# Patient Record
Sex: Male | Born: 1990 | Race: Black or African American | Hispanic: No | Marital: Single | Smoking: Current every day smoker
Health system: Southern US, Community
[De-identification: ages and names within clinical notes are randomized; demographics above are authoritative.]

---
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
## 2011-11-14 ENCOUNTER — Encounter (HOSPITAL_COMMUNITY): Payer: Self-pay

## 2011-11-14 ENCOUNTER — Emergency Department (HOSPITAL_COMMUNITY)
Admission: EM | Admit: 2011-11-14 | Discharge: 2011-11-14 | Disposition: A | Discharge status: Home / Self Care | Payer: No Typology Code available for payment source | Attending: Emergency Medicine | Admitting: Emergency Medicine

## 2011-11-14 ENCOUNTER — Emergency Department (HOSPITAL_COMMUNITY): Payer: No Typology Code available for payment source

## 2011-11-14 DIAGNOSIS — M545 Low back pain, unspecified: Secondary | ICD-10-CM | POA: Insufficient documentation

## 2011-11-14 DIAGNOSIS — Y92410 Unspecified street and highway as the place of occurrence of the external cause: Secondary | ICD-10-CM | POA: Insufficient documentation

## 2011-11-14 DIAGNOSIS — M79609 Pain in unspecified limb: Secondary | ICD-10-CM | POA: Insufficient documentation

## 2011-11-14 DIAGNOSIS — T07XXXA Unspecified multiple injuries, initial encounter: Secondary | ICD-10-CM

## 2011-11-14 DIAGNOSIS — IMO0002 Reserved for concepts with insufficient information to code with codable children: Secondary | ICD-10-CM | POA: Insufficient documentation

## 2011-11-14 DIAGNOSIS — R51 Headache: Secondary | ICD-10-CM | POA: Insufficient documentation

## 2011-11-14 DIAGNOSIS — S9030XA Contusion of unspecified foot, initial encounter: Secondary | ICD-10-CM | POA: Insufficient documentation

## 2011-11-14 MED ORDER — IBUPROFEN 600 MG PO TABS: 600.0000 mg | ORAL_TABLET | Freq: Once | ORAL | Status: AC

## 2011-11-14 MED ORDER — IBUPROFEN 200 MG PO TABS
600.0000 mg | ORAL_TABLET | Freq: Once | ORAL | Status: AC
  Administered 2011-11-14: 600 mg via ORAL
  Filled 2011-11-14: qty 3

## 2011-11-14 MED ORDER — BACITRACIN-NEOMYCIN-POLYMYXIN OINTMENT TUBE
TOPICAL_OINTMENT | Freq: Once | CUTANEOUS | Status: AC
  Administered 2011-11-14: 1 via TOPICAL
  Filled 2011-11-14: qty 15

## 2011-11-14 NOTE — ED Notes (Signed)
Patient transported to X-ray 
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
## 2011-11-14 NOTE — Discharge Instructions (Signed)
Abrasions Abrasions are skin scrapes. Their treatment depends on how large and deep the abrasion is. Abrasions do not extend through all layers of the skin. A cut or lesion through all skin layers is called a laceration. HOME CARE INSTRUCTIONS   If you were given a dressing, change it at least once a day or as instructed by your caregiver. If the bandage sticks, soak it off with a solution of water or hydrogen peroxide.   Twice a day, wash the area with soap and water to remove all the cream/ointment. You may do this in a sink, under a tub faucet, or in a shower. Rinse off the soap and pat dry with a clean towel. Look for signs of infection (see below).   Reapply cream/ointment according to your caregiver's instruction. This will help prevent infection and keep the bandage from sticking. Telfa or gauze over the wound and under the dressing or wrap will also help keep the bandage from sticking.   If the bandage becomes wet, dirty, or develops a foul smell, change it as soon as possible.   Only take over-the-counter or prescription medicines for pain, discomfort, or fever as directed by your caregiver.  SEEK IMMEDIATE MEDICAL CARE IF:   Increasing pain in the wound.   Signs of infection develop: redness, swelling, surrounding area is tender to touch, or pus coming from the wound.   You have a fever.   Any foul smell coming from the wound or dressing.  Most skin wounds heal within ten days. Facial wounds heal faster. However, an infection may occur despite proper treatment. You should have the wound checked for signs of infection within 24 to 48 hours or sooner if problems arise. If you were not given a wound-check appointment, look closely at the wound yourself on the second day for early signs of infection listed above. MAKE SURE YOU:   Understand these instructions.   Will watch your condition.   Will get help right away if you are not doing well or get worse.  Document Released:  05/10/2005 Document Revised: 07/20/2011 Document Reviewed: 07/04/2011 [employer] Patient Information 2012 [employer].Contusion A contusion is a deep bruise. Contusions are the result of an injury that caused bleeding under the skin. The contusion may turn blue, purple, or yellow. Minor injuries will give you a painless contusion, but more severe contusions may stay painful and swollen for a few weeks.  CAUSES  A contusion is usually caused by a blow, trauma, or direct force to an area of the body. SYMPTOMS   Swelling and redness of the injured area.   Bruising of the injured area.   Tenderness and soreness of the injured area.   Pain.  DIAGNOSIS  The diagnosis can be made by taking a history and physical exam. An X-ray, CT scan, or MRI may be needed to determine if there were any associated injuries, such as fractures. TREATMENT  Specific treatment will depend on what area of the body was injured. In general, the best treatment for a contusion is resting, icing, elevating, and applying cold compresses to the injured area. Over-the-counter medicines may also be recommended for pain control. Ask your caregiver what the best treatment is for your contusion. HOME CARE INSTRUCTIONS   Put ice on the injured area.   Put ice in a plastic bag.   Place a towel between your skin and the bag.   Leave the ice on for 15 to 20 minutes, 3 to 4 times a day.     Only take over-the-counter or prescription medicines for pain, discomfort, or fever as directed by your caregiver. Your caregiver may recommend avoiding anti-inflammatory medicines (aspirin, ibuprofen, and naproxen) for 48 hours because these medicines may increase bruising.   Rest the injured area.   If possible, elevate the injured area to reduce swelling.  SEEK IMMEDIATE MEDICAL CARE IF:   You have increased bruising or swelling.   You have pain that is getting worse.   Your swelling or pain is not relieved with medicines.  MAKE  SURE YOU:   Understand these instructions.   Will watch your condition.   Will get help right away if you are not doing well or get worse.  Document Released: 05/10/2005 Document Revised: 07/20/2011 Document Reviewed: 06/05/2011 [facility] Patient Information 2012 [place], [place].Contusion A contusion is a deep bruise. Contusions are the result of an injury that caused bleeding under the skin. The contusion may turn blue, purple, or yellow. Minor injuries will give you a painless contusion, but more severe contusions may stay painful and swollen for a few weeks.  CAUSES  A contusion is usually caused by a blow, trauma, or direct force to an area of the body. SYMPTOMS   Swelling and redness of the injured area.   Bruising of the injured area.   Tenderness and soreness of the injured area.   Pain.  DIAGNOSIS  The diagnosis can be made by taking a history and physical exam. An X-ray, CT scan, or MRI may be needed to determine if there were any associated injuries, such as fractures. TREATMENT  Specific treatment will depend on what area of the body was injured. In general, the best treatment for a contusion is resting, icing, elevating, and applying cold compresses to the injured area. Over-the-counter medicines may also be recommended for pain control. Ask your caregiver what the best treatment is for your contusion. HOME CARE INSTRUCTIONS   Put ice on the injured area.   Put ice in a plastic bag.   Place a towel between your skin and the bag.   Leave the ice on for 15 to 20 minutes, 3 to 4 times a day.   Only take over-the-counter or prescription medicines for pain, discomfort, or fever as directed by your caregiver. Your caregiver may recommend avoiding anti-inflammatory medicines (aspirin, ibuprofen, and naproxen) for 48 hours because these medicines may increase bruising.   Rest the injured area.   If possible, elevate the injured area to reduce swelling.  SEEK IMMEDIATE  MEDICAL CARE IF:   You have increased bruising or swelling.   You have pain that is getting worse.   Your swelling or pain is not relieved with medicines.  MAKE SURE YOU:   Understand these instructions.   Will watch your condition.   Will get help right away if you are not doing well or get worse.  Document Released: 05/10/2005 Document Revised: 07/20/2011 Document Reviewed: 06/05/2011 [facility] Patient Information 2012 [place], [place]. The x-ray of your foot is normal
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
## 2011-11-17 ENCOUNTER — Emergency Department (HOSPITAL_COMMUNITY)
Admission: EM | Admit: 2011-11-17 | Discharge: 2011-11-17 | Disposition: A | Discharge status: Home / Self Care | Payer: No Typology Code available for payment source | Attending: Emergency Medicine | Admitting: Emergency Medicine

## 2011-11-17 ENCOUNTER — Encounter (HOSPITAL_COMMUNITY): Payer: Self-pay | Admitting: Family Medicine

## 2011-11-17 DIAGNOSIS — T07XXXA Unspecified multiple injuries, initial encounter: Secondary | ICD-10-CM

## 2011-11-17 DIAGNOSIS — IMO0002 Reserved for concepts with insufficient information to code with codable children: Secondary | ICD-10-CM | POA: Insufficient documentation

## 2011-11-17 DIAGNOSIS — F17200 Nicotine dependence, unspecified, uncomplicated: Secondary | ICD-10-CM | POA: Insufficient documentation

## 2011-11-17 DIAGNOSIS — Z880 Allergy status to penicillin: Secondary | ICD-10-CM | POA: Insufficient documentation

## 2011-11-17 DIAGNOSIS — Y92410 Unspecified street and highway as the place of occurrence of the external cause: Secondary | ICD-10-CM | POA: Insufficient documentation

## 2011-11-17 DIAGNOSIS — S139XXA Sprain of joints and ligaments of unspecified parts of neck, initial encounter: Secondary | ICD-10-CM | POA: Insufficient documentation

## 2011-11-17 DIAGNOSIS — S134XXA Sprain of ligaments of cervical spine, initial encounter: Secondary | ICD-10-CM

## 2011-11-17 MED ORDER — IBUPROFEN 600 MG PO TABS: 600.0000 mg | ORAL_TABLET | Freq: Three times a day (TID) | ORAL | Status: AC

## 2011-11-17 MED ORDER — METHOCARBAMOL 500 MG PO TABS: 500.0000 mg | ORAL_TABLET | Freq: Four times a day (QID) | ORAL | Status: AC | PRN
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
## 2011-11-18 NOTE — ED Provider Notes (Signed)
Medical screening examination/treatment/procedure(s) were performed by non-physician practitioner and as supervising physician I was immediately available for consultation/collaboration.  Demaris Bousquet, MD 11/18/11 0006 

## 2011-11-19 NOTE — ED Provider Notes (Signed)
Medical screening examination/treatment/procedure(s) were performed by non-physician practitioner and as supervising physician I was immediately available for consultation/collaboration.   Dennis Clayton. Oletta Lamas, MD 11/19/11 [phone]

## 2012-06-06 ENCOUNTER — Encounter (HOSPITAL_COMMUNITY): Payer: Self-pay | Admitting: *Deleted

## 2012-06-06 ENCOUNTER — Emergency Department (HOSPITAL_COMMUNITY): Payer: Self-pay

## 2012-06-06 ENCOUNTER — Emergency Department (HOSPITAL_COMMUNITY)
Admission: EM | Admit: 2012-06-06 | Discharge: 2012-06-06 | Disposition: A | Discharge status: Home / Self Care | Payer: Self-pay | Attending: Emergency Medicine | Admitting: Emergency Medicine

## 2012-06-06 DIAGNOSIS — F17200 Nicotine dependence, unspecified, uncomplicated: Secondary | ICD-10-CM | POA: Insufficient documentation

## 2012-06-06 DIAGNOSIS — S61409A Unspecified open wound of unspecified hand, initial encounter: Secondary | ICD-10-CM | POA: Insufficient documentation

## 2012-06-06 DIAGNOSIS — S61411A Laceration without foreign body of right hand, initial encounter: Secondary | ICD-10-CM

## 2012-06-06 DIAGNOSIS — Y929 Unspecified place or not applicable: Secondary | ICD-10-CM | POA: Insufficient documentation

## 2012-06-06 DIAGNOSIS — Z23 Encounter for immunization: Secondary | ICD-10-CM | POA: Insufficient documentation

## 2012-06-06 DIAGNOSIS — W268XXA Contact with other sharp object(s), not elsewhere classified, initial encounter: Secondary | ICD-10-CM | POA: Insufficient documentation

## 2012-06-06 DIAGNOSIS — Y9389 Activity, other specified: Secondary | ICD-10-CM | POA: Insufficient documentation

## 2012-06-06 MED ORDER — HYDROCODONE-ACETAMINOPHEN 5-500 MG PO TABS: 1.0000 | ORAL_TABLET | Freq: Four times a day (QID) | ORAL | Status: DC | PRN

## 2012-06-06 MED ORDER — TETANUS-DIPHTH-ACELL PERTUSSIS 5-2.5-18.5 LF-MCG/0.5 IM SUSP
0.5000 mL | Freq: Once | INTRAMUSCULAR | Status: AC
  Administered 2012-06-06: 0.5 mL via INTRAMUSCULAR
  Filled 2012-06-06: qty 0.5

## 2012-06-06 MED ORDER — LIDOCAINE-EPINEPHRINE 2 %-1:100000 IJ SOLN
20.0000 mL | Freq: Once | INTRAMUSCULAR | Status: AC
  Administered 2012-06-06: 20 mL via INTRADERMAL
  Filled 2012-06-06: qty 20

## 2012-06-06 MED ORDER — OXYCODONE-ACETAMINOPHEN 5-325 MG PO TABS
1.0000 | ORAL_TABLET | Freq: Once | ORAL | Status: AC
  Administered 2012-06-06: 1 via ORAL
  Filled 2012-06-06: qty 1
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
## 2012-06-06 NOTE — ED Notes (Signed)
MD at bedside suturing

## 2012-06-06 NOTE — ED Notes (Signed)
Right hand cleaned and wound dressed with non-stick dressing with kling wrap.  Patient tolerated well.

## 2012-06-06 NOTE — ED Provider Notes (Signed)
Medical screening examination/treatment/procedure(s) were conducted as a shared visit with non-physician practitioner(s) and myself.  I personally evaluated the patient during the encounter.  Inspected hand.  Noted lacerations.  No palpable FBs or deformities.  No FBs noted on imaging.  Pt tolerated closure well.  He will follow-up for suture removal.  Tobin Chad, MD 06/06/12 1722
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
## 2012-06-16 ENCOUNTER — Emergency Department (HOSPITAL_COMMUNITY)
Admission: EM | Admit: 2012-06-16 | Discharge: 2012-06-16 | Disposition: A | Discharge status: Home / Self Care | Payer: Self-pay | Attending: Emergency Medicine | Admitting: Emergency Medicine

## 2012-06-16 ENCOUNTER — Encounter (HOSPITAL_COMMUNITY): Payer: Self-pay

## 2012-06-16 DIAGNOSIS — F17200 Nicotine dependence, unspecified, uncomplicated: Secondary | ICD-10-CM | POA: Insufficient documentation

## 2012-06-16 DIAGNOSIS — Z4802 Encounter for removal of sutures: Secondary | ICD-10-CM | POA: Insufficient documentation

## 2012-06-16 NOTE — ED Notes (Signed)
Pt had sutures placed 10/25.  Pt presents today for suture removal.

## 2012-06-16 NOTE — ED Provider Notes (Signed)
History     CSN: [id number]  Arrival date & time 06/16/12  1004   First MD Initiated Contact with Patient 06/16/12 1006      Chief Complaint  Patient presents with  . Suture / Staple Removal    (Consider location/radiation/quality/duration/timing/severity/associated sxs/prior treatment) HPI Comments: Patient presents today for suture removal.  Sutures placed on 06/06/12.  He reports that he has not had any drainage from the lacerations.  Lacerations healing well.  He denies fever or chills.  Patient is a 21 y.o. male presenting with suture removal. The history is provided by the patient.  Suture / Staple Removal  There has been no treatment since the wound repair. There has been no drainage from the wound. There is no redness present. There is no swelling present. The pain has improved.    History reviewed. No pertinent past medical history.  History reviewed. No pertinent past surgical history.  No family history on file.  History  Substance Use Topics  . Smoking status: Current Every Day Smoker    Types: Cigars  . Smokeless tobacco: Not on file  . Alcohol Use: No      Review of Systems  Constitutional: Negative for fever and chills.  Skin: Positive for wound. Negative for color change.    Allergies  Penicillins  Home Medications  No current outpatient prescriptions on file.  BP 106/72  Pulse 84  Temp 99 F (37.2 C) (Oral)  Resp 16  SpO2 99%  Physical Exam  Nursing note and vitals reviewed. Constitutional: He appears well-developed and well-nourished. No distress.  HENT:  Head: Normocephalic and atraumatic.  Mouth/Throat: Oropharynx is clear and moist.  Cardiovascular: Normal rate, regular rhythm and normal heart sounds.   Pulmonary/Chest: Effort normal.  Musculoskeletal:       Right wrist: He exhibits normal range of motion and no bony tenderness.  Skin: Skin is warm and dry. He is not diaphoretic. No erythema.       Lacerations of the hand  appearing to be healing well.  No surrounding erythema, induration, or warmth.  No drainage.  Approximately 1 cm of minimal gaping of laceration after stiches removed.      ED Course  Procedures (including critical care time)  Labs Reviewed - No data to display No results found.   No diagnosis found.    MDM  Sutures removed.  Patient afebrile.  No drainage from the laceration.  No surrounding erythema or induration.  No signs of infection.  After the sutures were removed there was minimal gaping of the laceration.  Steri strips applied.          Pascal Lux Dalton, PA-C 06/16/12 2104

## 2012-06-17 NOTE — ED Provider Notes (Signed)
Medical screening examination/treatment/procedure(s) were performed by non-physician practitioner and as supervising physician I was immediately available for consultation/collaboration.   Richardean Canal, MD 06/17/12 [phone]

## 2013-01-23 ENCOUNTER — Encounter (HOSPITAL_COMMUNITY): Payer: Self-pay

## 2013-01-23 ENCOUNTER — Emergency Department (INDEPENDENT_AMBULATORY_CARE_PROVIDER_SITE_OTHER)
Admission: EM | Admit: 2013-01-23 | Discharge: 2013-01-23 | Disposition: A | Discharge status: Home / Self Care | Payer: Self-pay | Source: Home / Self Care

## 2013-01-23 DIAGNOSIS — S0100XA Unspecified open wound of scalp, initial encounter: Secondary | ICD-10-CM

## 2013-01-23 DIAGNOSIS — S0102XA Laceration with foreign body of scalp, initial encounter: Secondary | ICD-10-CM

## 2013-01-23 DIAGNOSIS — S41112A Laceration without foreign body of left upper arm, initial encounter: Secondary | ICD-10-CM

## 2013-01-23 DIAGNOSIS — S41111A Laceration without foreign body of right upper arm, initial encounter: Secondary | ICD-10-CM

## 2013-01-23 DIAGNOSIS — S41109A Unspecified open wound of unspecified upper arm, initial encounter: Secondary | ICD-10-CM

## 2013-01-23 MED ORDER — CLINDAMYCIN HCL 300 MG PO CAPS: ORAL_CAPSULE | ORAL | Status: DC
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
## 2013-01-23 NOTE — ED Notes (Signed)
States he had a tetanus shot 06-16-2012, when he had a laceration to his hand
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
## 2013-01-24 NOTE — ED Provider Notes (Signed)
Medical screening examination/treatment/procedure(s) were performed by non-physician practitioner and as supervising physician I was immediately available for consultation/collaboration.  Leslee Home, M.D.  Reuben Likes, MD 01/24/13 2120

## 2013-02-01 ENCOUNTER — Emergency Department (INDEPENDENT_AMBULATORY_CARE_PROVIDER_SITE_OTHER)
Admission: EM | Admit: 2013-02-01 | Discharge: 2013-02-01 | Disposition: A | Discharge status: Home / Self Care | Payer: Self-pay | Source: Home / Self Care | Attending: Emergency Medicine | Admitting: Emergency Medicine

## 2013-02-01 ENCOUNTER — Encounter (HOSPITAL_COMMUNITY): Payer: Self-pay | Admitting: Emergency Medicine

## 2013-02-01 DIAGNOSIS — Z4802 Encounter for removal of sutures: Secondary | ICD-10-CM

## 2013-02-01 DIAGNOSIS — IMO0002 Reserved for concepts with insufficient information to code with codable children: Secondary | ICD-10-CM
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
## 2013-02-01 NOTE — ED Notes (Signed)
Pt is here to have stitches removed... Voices no new concerns... He is alert and oriented w/no signs of acute distress.

## 2014-11-20 ENCOUNTER — Emergency Department (HOSPITAL_COMMUNITY)
Admission: EM | Admit: 2014-11-20 | Discharge: 2014-11-20 | Disposition: A | Discharge status: Home / Self Care | Payer: Self-pay | Attending: Emergency Medicine | Admitting: Emergency Medicine

## 2014-11-20 ENCOUNTER — Encounter (HOSPITAL_COMMUNITY): Payer: Self-pay | Admitting: *Deleted

## 2014-11-20 ENCOUNTER — Ambulatory Visit (HOSPITAL_COMMUNITY): Admission: RE | Admit: 2014-11-20 | Payer: Self-pay | Source: Ambulatory Visit

## 2014-11-20 DIAGNOSIS — R252 Cramp and spasm: Secondary | ICD-10-CM

## 2014-11-20 DIAGNOSIS — M79605 Pain in left leg: Secondary | ICD-10-CM | POA: Insufficient documentation

## 2014-11-20 DIAGNOSIS — Z720 Tobacco use: Secondary | ICD-10-CM | POA: Insufficient documentation

## 2014-11-20 DIAGNOSIS — Z792 Long term (current) use of antibiotics: Secondary | ICD-10-CM | POA: Insufficient documentation

## 2014-11-20 DIAGNOSIS — Z880 Allergy status to penicillin: Secondary | ICD-10-CM | POA: Insufficient documentation

## 2014-11-20 MED ORDER — IBUPROFEN 600 MG PO TABS: 600.0000 mg | ORAL_TABLET | Freq: Four times a day (QID) | ORAL | Status: DC | PRN

## 2014-11-20 MED ORDER — TRAMADOL HCL 50 MG PO TABS: 50.0000 mg | ORAL_TABLET | Freq: Four times a day (QID) | ORAL | Status: DC | PRN

## 2014-11-20 NOTE — ED Provider Notes (Signed)
CSN: [id number]     Arrival date & time 11/20/14  0508 History   First MD Initiated Contact with Patient 11/20/14 0615     Chief Complaint  Patient presents with  . Leg Pain     (Consider location/radiation/quality/duration/timing/severity/associated sxs/prior Treatment) HPI  Pt is a 24yo male presenting to ED with c/o intermittent Left leg pain from the hip down for the last several weeks.  Pt states pain is a pressure sensation in his left calf, moderate relief with ibuprofen.  Pain is cramping, 8/10 at worst. Denies recent injury or trauma including falls or heavy lifting.  Denies redness, warmth, swelling, or wounds to Left lower leg. Denies back pain. Denies chest pain or SOB. No hx of sickle cell anemia. No hx of blood clots. No hx of previous injury to lower leg. No recent travel or surgery.   History reviewed. No pertinent past medical history. History reviewed. No pertinent past surgical history. No family history on file. History  Substance Use Topics  . Smoking status: Current Every Day Smoker    Types: Cigars  . Smokeless tobacco: Not on file  . Alcohol Use: No    Review of Systems  Constitutional: Negative for fever, chills and fatigue.  Respiratory: Negative for shortness of breath.   Cardiovascular: Negative for chest pain, palpitations and leg swelling.  Gastrointestinal: Negative for nausea and vomiting.  Musculoskeletal: Positive for myalgias ( Left calf pain). Negative for back pain, joint swelling, arthralgias, gait problem, neck pain and neck stiffness.  Skin: Negative for color change, pallor, rash and wound.  Neurological: Negative for weakness and numbness.  All other systems reviewed and are negative.     Allergies  Penicillins  Home Medications   Prior to Admission medications   Medication Sig Start Date End Date Taking? Authorizing Provider  clindamycin (CLEOCIN) 300 MG capsule One cap bid for 4 days 01/23/13   Hayden Rasmussen, NP  ibuprofen  (ADVIL,MOTRIN) 600 MG tablet Take 1 tablet (600 mg total) by mouth every 6 (six) hours as needed. 11/20/14   Junius Finner, PA-C  traMADol (ULTRAM) 50 MG tablet Take 1 tablet (50 mg total) by mouth every 6 (six) hours as needed. 11/20/14   Junius Finner, PA-C   BP 116/74 mmHg  Pulse 71  Temp(Src) 98.2 F (36.8 C) (Oral)  Resp 20  Ht  (1.753 m)  Wt 155 lb 2 oz (70.364 kg)  BMI 22.90 kg/m2  SpO2 99% Physical Exam  Constitutional: He is oriented to person, place, and time. He appears well-developed and well-nourished.  HENT:  Head: Normocephalic and atraumatic.  Eyes: EOM are normal.  Neck: Normal range of motion. Neck supple.  Cardiovascular: Normal rate.   Pulses:      Dorsalis pedis pulses are 2+ on the left side.  Pulmonary/Chest: Effort normal.  Musculoskeletal: Normal range of motion. He exhibits tenderness. He exhibits no edema.  Left lower extremity: FROM left hip, knee, and ankle w/o tenderness. Mild tenderness to Left calf. No edema.   Neurological: He is alert and oriented to person, place, and time.  Left lower extremity: sensation in tact   Skin: Skin is warm and dry. No rash noted. No erythema.  Left lower extremity: skin in tact, no ecchymosis, erythema, or warmth.   Psychiatric: He has a normal mood and affect. His behavior is normal.  Nursing note and vitals reviewed.   ED Course  Procedures (including critical care time) Labs Review Labs Reviewed - No data to display  Imaging Review No results found.   EKG Interpretation None      MDM   Final diagnoses:  Cramps of left lower extremity    Pt presenting to ED with c/o intermittent Left lower leg pain that started several weeks ago. Improvement with ibuprofen.  No recent injuries. Pt is low risk for blood clots.   6:16 AM discussed with pt that a venous doppler is recommended to r/o a blood clot as no other obvious source of pt's reported pain in his Left lower leg. No evidence of underlying  infection. Pt denies CP or SOB.  Advised pt that personnel that perform venous doppler do not come in until 8AM. Pt states he has somewhere to be and requests to come back for testing.  Pt is safe to be discharged home at this time. Strict return precautions provided. Info for outpatient venous doppler provided.  Pt verbalized understanding and agreement with tx plan.   Junius Finnerrin O'Malley, PA-C 11/20/14 [id number]  Marisa Severinlga Otter, MD 11/20/14 [phone]

## 2014-11-20 NOTE — ED Notes (Signed)
Patient with pain in the left leg from the hip down.  He denies trauma.  Denies any wounds/ redness/swelling.  Patient states it feels like pressure in his leg.  He does not have hx of sickle cell.  No recent travel.  He last took ibuprofen at 0500

## 2016-01-12 ENCOUNTER — Encounter (HOSPITAL_COMMUNITY): Payer: Self-pay | Admitting: Emergency Medicine

## 2016-01-12 ENCOUNTER — Emergency Department (HOSPITAL_COMMUNITY)
Admission: EM | Admit: 2016-01-12 | Discharge: 2016-01-12 | Disposition: A | Discharge status: Home / Self Care | Payer: Self-pay | Attending: Emergency Medicine | Admitting: Emergency Medicine

## 2016-01-12 DIAGNOSIS — Z79899 Other long term (current) drug therapy: Secondary | ICD-10-CM | POA: Insufficient documentation

## 2016-01-12 DIAGNOSIS — F17200 Nicotine dependence, unspecified, uncomplicated: Secondary | ICD-10-CM | POA: Insufficient documentation

## 2016-01-12 DIAGNOSIS — L729 Follicular cyst of the skin and subcutaneous tissue, unspecified: Secondary | ICD-10-CM

## 2016-01-12 DIAGNOSIS — L0231 Cutaneous abscess of buttock: Secondary | ICD-10-CM | POA: Insufficient documentation

## 2016-01-12 DIAGNOSIS — Z791 Long term (current) use of non-steroidal anti-inflammatories (NSAID): Secondary | ICD-10-CM | POA: Insufficient documentation

## 2016-01-12 NOTE — ED Provider Notes (Signed)
CSN: [id number]     Arrival date & time 01/12/16  0522 History   First MD Initiated Contact with Patient 01/12/16 [phone]     Chief Complaint  Patient presents with  . Tailbone Pain     (Consider location/radiation/quality/duration/timing/severity/associated sxs/prior Treatment) HPI  Dennis Clayton is a(n) 25 y.o. male who presents to the ED with a cc of mass on tailbone. He states he's noticed that for several months. However, it is tender when he sits on it. He denies pain when he is not sitting. He denies drainage or pain with defecation. No previous injuries. He denies any fevers or chills.   No past medical history on file. No past surgical history on file. No family history on file. Social History  Substance Use Topics  . Smoking status: Current Every Day Smoker    Types: Cigars  . Smokeless tobacco: Not on file  . Alcohol Use: No    Review of Systems  Constitutional: Negative for fever and chills.  Skin: Negative for color change and wound.       mass      Allergies  Penicillins  Home Medications   Prior to Admission medications   Medication Sig Start Date End Date Taking? Authorizing Provider  clindamycin (CLEOCIN) 300 MG capsule One cap bid for 4 days 01/23/13   Hayden Rasmussenavid Mabe, NP  ibuprofen (ADVIL,MOTRIN) 600 MG tablet Take 1 tablet (600 mg total) by mouth every 6 (six) hours as needed. 11/20/14   Junius FinnerErin O'Malley, PA-C  traMADol (ULTRAM) 50 MG tablet Take 1 tablet (50 mg total) by mouth every 6 (six) hours as needed. 11/20/14   Junius FinnerErin O'Malley, PA-C   BP 133/88 mmHg  Pulse 77  Temp(Src) 99 F (37.2 C) (Oral)  Resp 20  Wt 68.04 kg  SpO2 100% Physical Exam  Constitutional: He appears well-developed and well-nourished. No distress.  HENT:  Head: Normocephalic and atraumatic.  Eyes: Conjunctivae are normal. No scleral icterus.  Neck: Normal range of motion. Neck supple.  Cardiovascular: Normal rate, regular rhythm and normal heart sounds.   Pulmonary/Chest: Effort  normal and breath sounds normal. No respiratory distress.  Abdominal: Soft. There is no tenderness.  Musculoskeletal: He exhibits no edema.  Neurological: He is alert.  Skin: Skin is warm and dry. He is not diaphoretic.  1-2 cm mobile mass over the coccyx.  Mild ttp. No fluctuance or induration.   Psychiatric: His behavior is normal.  Nursing note and vitals reviewed.   ED Course  Procedures (including critical care time) Labs Review Labs Reviewed - No data to display  Imaging Review No results found. I have personally reviewed and evaluated these images and lab results as part of my medical decision-making.   EKG Interpretation None      MDM   Final diagnoses:  Cyst of buttocks    Patient with cyst over the coccyx. No signs of infection. Refer to surgery for elective excision. No signs of infection. Discussed return precautions   Arthor Captainbigail Yida Hyams, PA-C 01/12/16 0750  Doug SouSam Jacubowitz, MD 01/12/16 [phone]

## 2016-01-12 NOTE — ED Notes (Addendum)
Pt here by POV with c/o "a little bump" right above my tail bone". Reports it's been there x 1 week. Denies drainage.

## 2016-01-12 NOTE — Discharge Instructions (Signed)
Excision of Skin Lesions °Excision of a skin lesion refers to the removal of a section of skin by making small cuts (incisions) in the skin. This procedure may be done to remove a cancerous (malignant) or noncancerous (benign) growth on the skin. It is typically done to treat or prevent cancer or infection. It may also be done to improve cosmetic appearance. °The procedure may be done to remove: °· Cancerous growths, such as basal cell carcinoma, squamous cell carcinoma, or melanoma. °· Noncancerous growths, such as a cyst or lipoma. °· Growths, such as moles or skin tags, which may be removed for cosmetic reasons. °Various excision or surgical techniques may be used depending on your condition, the location of the lesion, and your overall health. °LET YOUR HEALTH CARE PROVIDER KNOW ABOUT: °· Any allergies you have. °· All medicines you are taking, including vitamins, herbs, eye drops, creams, and over-the-counter medicines. °· Previous problems you or members of your family have had with the use of anesthetics. °· Any blood disorders you have. °· Previous surgeries you have had. °· Any medical conditions you have. °· Whether you are pregnant or may be pregnant. °RISKS AND COMPLICATIONS °Generally, this is a safe procedure. However, problems may occur, including: °· Bleeding. °· Infection. °· Scarring. °· Recurrence of the cyst, lipoma, or cancer. °· Changes in skin sensation or appearance, such as discoloration or swelling. °· Reaction to the anesthetics. °· Allergic reaction to surgical materials or ointments. °· Damage to nerves, blood vessels, muscles, or other structures. °· Continued pain. °BEFORE THE PROCEDURE °· Ask your health care provider about: °¨ Changing or stopping your regular medicines. This is especially important if you are taking diabetes medicines or blood thinners. °¨ Taking medicines such as aspirin and ibuprofen. These medicines can thin your blood. Do not take these medicines before your  procedure if your health care provider instructs you not to. °· You may be asked to take certain medicines. °· You may be asked to stop smoking. °· You may have an exam or testing. °· Plan to have someone take you home after the procedure. °· Plan to have someone help you with activities during recovery. °PROCEDURE °· To reduce your risk of infection: °¨ Your health care team will wash or sanitize their hands. °¨ Your skin will be washed with soap. °· You will be given a medicine to numb the area (local anesthetic). °· One of the following excision techniques will be performed. °· At the end of any of these procedures, antibiotic ointment will be applied as needed. °Each of the following techniques may vary among health care providers and hospitals. °Complete Surgical Excision °The area of skin that needs to be removed will be marked with a pen. Using a small scalpel or scissors, the surgeon will gently cut around and under the lesion until it is completely removed. The lesion will be placed in a fluid and sent to the lab for examination. If necessary, bleeding will be controlled with a device that delivers heat (electrocautery). The edges of the wound may be stitched (sutured) together, and a bandage (dressing) will be applied. This procedure may be performed to treat a cancerous growth or a noncancerous cyst or lesion. °Excision of a Cyst °The surgeon will make an incision on the cyst. The entire cyst will be removed through the incision. The incision may be closed with sutures. °Shave Excision °During shave excision, the surgeon will use a small blade or an electrically heated loop instrument to   shave off the lesion. This may be done to remove a mole or a skin tag. The wound will usually be left to heal on its own without sutures. °Punch Excision °During punch excision, the surgeon will use a small tool that is like a cookie cutter or a hole punch to cut a circle shape out of the skin. The outer edges of the skin  will be sutured together. This may be done to remove a mole or a scar or to perform a biopsy of the lesion. °Mohs Micrographic Surgery °During Mohs micrographic surgery, layers of the lesion will be removed with a scalpel or a loop instrument and will be examined right away under a microscope. Layers will be removed until all of the abnormal or cancerous tissue has been removed. This procedure is minimally invasive, and it ensures the best cosmetic outcome. It involves the removal of as little normal tissue as possible. Mohs is usually done to treat skin cancer, such as basal cell carcinoma or squamous cell carcinoma, particularly on the face and ears. Depending on the size of the surgical wound, it may be sutured closed. °AFTER THE PROCEDURE °· Return to your normal activities as told by your health care provider. °· Talk with your health care provider to discuss any test results, treatment options, and if necessary, the need for more tests. °  °This information is not intended to replace advice given to you by your health care provider. Make sure you discuss any questions you have with your health care provider. °  °Document Released: 10/25/2009 Document Revised: 04/21/2015 Document Reviewed: 09/16/2014 °Elsevier Interactive Patient Education ©2016 [employer] ° °

## 2019-01-07 ENCOUNTER — Ambulatory Visit (HOSPITAL_COMMUNITY)
Admission: EM | Admit: 2019-01-07 | Discharge: 2019-01-07 | Disposition: A | Discharge status: Home / Self Care | Payer: Self-pay | Attending: Family Medicine | Admitting: Family Medicine

## 2019-01-07 ENCOUNTER — Other Ambulatory Visit: Payer: Self-pay

## 2019-01-07 ENCOUNTER — Encounter (HOSPITAL_COMMUNITY): Payer: Self-pay | Admitting: Emergency Medicine

## 2019-01-07 DIAGNOSIS — B029 Zoster without complications: Secondary | ICD-10-CM

## 2019-01-07 MED ORDER — VALACYCLOVIR HCL 1 G PO TABS: 1000.0000 mg | ORAL_TABLET | Freq: Three times a day (TID) | ORAL | 0 refills | Status: AC

## 2019-01-07 MED ORDER — VALACYCLOVIR HCL 1 G PO TABS: 1000.0000 mg | ORAL_TABLET | Freq: Three times a day (TID) | ORAL | 0 refills | Status: DC

## 2019-01-07 NOTE — ED Provider Notes (Signed)
[facility]    CSN: [id number] Arrival date & time: 01/07/19  1754     History   Chief Complaint Chief Complaint  Patient presents with  . Rash    HPI Dennis Adrian ProwsKEITH Clayton is a 28 y.o. male.   HPI  Patient noted some irritating rash on his left back day before yesterday.  Today noticed that the rash was spreading.  It is painful and uncomfortable for him.  He did have chickenpox as a child.  He is never had this rash before.  He states he is under a lot of stress because he has a 30-year-old, and the child's mother is keeping her away from him.  He is working.  He is financially strapped.  He does not have insurance.  History reviewed. No pertinent past medical history.  There are no active problems to display for this patient.   History reviewed. No pertinent surgical history.     Home Medications    Prior to Admission medications   Medication Sig Start Date End Date Taking? Authorizing Provider  valACYclovir (VALTREX) 1000 MG tablet Take 1 tablet (1,000 mg total) by mouth 3 (three) times daily. 01/07/19   Eustace MooreNelson, [place] Sandall Sue, MD    Family History Family History  Problem Relation Age of Onset  . Healthy Mother     Social History Social History   Tobacco Use  . Smoking status: Current Every Day Smoker    Types: Cigars  Substance Use Topics  . Alcohol use: No  . Drug use: No     Allergies   Penicillins   Review of Systems Review of Systems  Constitutional: Negative for chills and fever.  HENT: Negative for ear pain and sore throat.   Eyes: Negative for pain and visual disturbance.  Respiratory: Negative for cough and shortness of breath.   Cardiovascular: Negative for chest pain and palpitations.  Gastrointestinal: Negative for abdominal pain and vomiting.  Genitourinary: Negative for dysuria and hematuria.  Musculoskeletal: Negative for arthralgias and back pain.  Skin: Positive for rash. Negative for color change.  Neurological: Negative  for seizures and syncope.  All other systems reviewed and are negative.    Physical Exam Triage Vital Signs ED Triage Vitals  Enc Vitals Group     BP 01/07/19 1820 119/84     Pulse Rate 01/07/19 1820 88     Resp 01/07/19 1820 18     Temp 01/07/19 1820 98.8 F (37.1 C)     Temp Source 01/07/19 1820 Oral     SpO2 01/07/19 1820 100 %     Weight --      Height --      Head Circumference --      Peak Flow --      Pain Score 01/07/19 1817 5     Pain Loc --      Pain Edu? --      Excl. in GC? --    No data found.  Updated Vital Signs BP 119/84 (BP Location: Left Arm)   Pulse 88   Temp 98.8 F (37.1 C) (Oral)   Resp 18   SpO2 100%      Physical Exam Constitutional:      General: He is not in acute distress.    Appearance: He is well-developed.  HENT:     Head: Normocephalic and atraumatic.  Eyes:     Conjunctiva/sclera: Conjunctivae normal.     Pupils: Pupils are equal, round, and reactive to light.  Neck:     Musculoskeletal: Normal range of motion.  Cardiovascular:     Rate and Rhythm: Normal rate.  Pulmonary:     Effort: Pulmonary effort is normal. No respiratory distress.  Abdominal:     General: There is no distension.     Palpations: Abdomen is soft.  Musculoskeletal: Normal range of motion.  Skin:    General: Skin is warm and dry.       Neurological:     Mental Status: He is alert.      UC Treatments / Results  Labs (all labs ordered are listed, but only abnormal results are displayed) Labs Reviewed - No data to display  EKG None  Radiology No results found.  Procedures Procedures (including critical care time)  Medications Ordered in UC Medications - No data to display  Initial Impression / Assessment and Plan / UC Course  I have reviewed the triage vital signs and the nursing notes.  Pertinent labs & imaging results that were available during my care of the patient were reviewed by me and considered in my medical decision making  (see chart for details).      Final Clinical Impressions(s) / UC Diagnoses   Final diagnoses:  Herpes zoster without complication     Discharge Instructions     Take the anti viral medicine for one week Try not to scratch the rash May put cream or lotion on it (like cortisone) Call for problems  Good Rx card was printed for patient with a coupon for [facility] ED Prescriptions    Medication Sig Dispense Auth. Provider   valACYclovir (VALTREX) 1000 MG tablet Take 1 tablet (1,000 mg total) by mouth 3 (three) times daily. 21 tablet Eustace Moore, MD     Controlled Substance Prescriptions [place] Controlled Substance Registry consulted? Not Applicable   Eustace Moore, MD 01/07/19 [phone]

## 2019-01-07 NOTE — ED Triage Notes (Signed)
Rash noticed Sunday or Monday.  Blistery rash to left chest , around torso to left back.  Describes burning sensation.

## 2019-01-07 NOTE — Discharge Instructions (Addendum)
Take the anti viral medicine for one week Try not to scratch the rash May put cream or lotion on it (like cortisone) Call for problems

## 2020-03-17 ENCOUNTER — Encounter (HOSPITAL_COMMUNITY): Payer: Self-pay

## 2020-03-17 ENCOUNTER — Ambulatory Visit (HOSPITAL_COMMUNITY)
Admission: EM | Admit: 2020-03-17 | Discharge: 2020-03-17 | Disposition: A | Discharge status: Home / Self Care | Payer: Self-pay | Attending: Family Medicine | Admitting: Family Medicine

## 2020-03-17 ENCOUNTER — Other Ambulatory Visit: Payer: Self-pay

## 2020-03-17 DIAGNOSIS — B360 Pityriasis versicolor: Secondary | ICD-10-CM

## 2020-03-17 MED ORDER — KETOCONAZOLE 2 % EX SHAM: MEDICATED_SHAMPOO | CUTANEOUS | 0 refills | Status: AC

## 2020-03-17 NOTE — ED Triage Notes (Signed)
Pt presents to UC for rash on left neck x3 months. Pt states rash does not itch or hurt. Rash appears circular and lighter in skin tone. Pt denies other complaints at this time.

## 2020-03-17 NOTE — Discharge Instructions (Signed)
Shampoo once a day for the next week, then 3x a week until resolved.  If no improvement or if worsening, especially after two weeks of treatment, please return

## 2020-03-19 NOTE — ED Provider Notes (Signed)
[facility]    CSN: [id number] Arrival date & time: 03/17/20  1515      History   Chief Complaint Chief Complaint  Patient presents with   Rash    HPI Dennis Clayton. is a 29 y.o. male.   Dennis Clayton. presents with complaints of rash to his neck. Started to left neck, but now he notes it to right neck as well. Has been present for months. No itching, no pain, no drainage. No rash to chest, hands or trunk. Denies any previous similar. No others in the home with similar. Hasn't tried any treatments for symptoms.    ROS per HPI, negative if not otherwise mentioned.      History reviewed. No pertinent past medical history.  There are no problems to display for this patient.   History reviewed. No pertinent surgical history.     Home Medications    Prior to Admission medications   Medication Sig Start Date End Date Taking? Authorizing Provider  ketoconazole (NIZORAL) 2 % shampoo Apply to rash, leave on for 5 minutes, then rinse. Once a day for the next week. Then 3 times a week until it has resolved 03/17/20   Linus Mako B, NP  valACYclovir (VALTREX) 1000 MG tablet Take 1 tablet (1,000 mg total) by mouth 3 (three) times daily. 01/07/19   Eustace Moore, MD    Family History Family History  Problem Relation Age of Onset   Healthy Mother     Social History Social History   Tobacco Use   Smoking status: Current Every Day Smoker    Types: Cigars  Substance Use Topics   Alcohol use: No   Drug use: No     Allergies   Penicillins   Review of Systems Review of Systems   Physical Exam Triage Vital Signs ED Triage Vitals  Enc Vitals Group     BP 03/17/20 1619 (!) 151/93     Pulse Rate 03/17/20 1619 95     Resp 03/17/20 1619 18     Temp 03/17/20 1619 98.4 F (36.9 C)     Temp Source 03/17/20 1619 Oral     SpO2 03/17/20 1619 100 %     Weight --      Height --      Head Circumference --      Peak Flow --       Pain Score 03/17/20 1632 0     Pain Loc --      Pain Edu? --      Excl. in GC? --    No data found.  Updated Vital Signs BP (!) 151/93 (BP Location: Left Arm)    Pulse 95    Temp 98.4 F (36.9 C) (Oral)    Resp 18    SpO2 100%    Physical Exam Constitutional:      Appearance: He is well-developed.  Cardiovascular:     Rate and Rhythm: Normal rate.  Pulmonary:     Effort: Pulmonary effort is normal.  Skin:    General: Skin is warm and dry.     Findings: Rash present.     Comments: Hypopigmented oval lesions to neck, not raised, no plaques papules or vesicles; see photos   Neurological:     Mental Status: He is alert and oriented to person, place, and time.        UC Treatments / Results  Labs (all labs ordered are listed, but only abnormal results  are displayed) Labs Reviewed - No data to display  EKG   Radiology No results found.  Procedures Procedures (including critical care time)  Medications Ordered in UC Medications - No data to display  Initial Impression / Assessment and Plan / UC Course  I have reviewed the triage vital signs and the nursing notes.  Pertinent labs & imaging results that were available during my care of the patient were reviewed by me and considered in my medical decision making (see chart for details).     Rash is concerning for tinea versicolor with ketoconazole provided. Follow up as needed.  Final Clinical Impressions(s) / UC Diagnoses   Final diagnoses:  Tinea versicolor     Discharge Instructions     Shampoo once a day for the next week, then 3x a week until resolved.  If no improvement or if worsening, especially after two weeks of treatment, please return    ED Prescriptions    Medication Sig Dispense Auth. Provider   ketoconazole (NIZORAL) 2 % shampoo Apply to rash, leave on for 5 minutes, then rinse. Once a day for the next week. Then 3 times a week until it has resolved 120 mL Linus Mako B, NP     PDMP  not reviewed this encounter.   Georgetta Haber, NP 03/19/20 [phone]

## 2020-04-17 ENCOUNTER — Emergency Department (HOSPITAL_COMMUNITY)
Admission: EM | Admit: 2020-04-17 | Discharge: 2020-04-18 | Disposition: A | Discharge status: Home / Self Care | Payer: Self-pay | Attending: Emergency Medicine | Admitting: Emergency Medicine

## 2020-04-17 ENCOUNTER — Other Ambulatory Visit: Payer: Self-pay

## 2020-04-17 DIAGNOSIS — L040 Acute lymphadenitis of face, head and neck: Secondary | ICD-10-CM | POA: Insufficient documentation

## 2020-04-17 DIAGNOSIS — Z5321 Procedure and treatment not carried out due to patient leaving prior to being seen by health care provider: Secondary | ICD-10-CM | POA: Insufficient documentation

## 2020-04-18 ENCOUNTER — Other Ambulatory Visit: Payer: Self-pay

## 2020-04-18 ENCOUNTER — Encounter (HOSPITAL_COMMUNITY): Payer: Self-pay

## 2020-04-18 ENCOUNTER — Ambulatory Visit (HOSPITAL_COMMUNITY)
Admission: EM | Admit: 2020-04-18 | Discharge: 2020-04-18 | Disposition: A | Discharge status: Home / Self Care | Payer: Self-pay

## 2020-04-18 DIAGNOSIS — R590 Localized enlarged lymph nodes: Secondary | ICD-10-CM

## 2020-04-18 NOTE — ED Triage Notes (Addendum)
Pt presents with a tender, swollen area in his L neck. Denies SOB or fever. He states that over the past month, he has been intermittently tired. A&Ox4.

## 2020-04-18 NOTE — ED Triage Notes (Signed)
Pt presents with sore throat for over a week.  

## 2020-04-18 NOTE — Discharge Instructions (Signed)
Exam is overall reassuring.  I would expect gradual improvement of this swelling over the next few weeks to month.  If symptoms worsen or do not improve in the next week to return to be seen or to follow up with a primary care provider.

## 2020-04-18 NOTE — ED Provider Notes (Signed)
[facility]    CSN: [id number] Arrival date & time: 04/18/20  1119      History   Chief Complaint Chief Complaint  Patient presents with  . Sore Throat    HPI Dennis Prophet Renwick. is a 29 y.o. male.   Dennis Isidore Moos. presents with complaints of swelling to left neck/jaw with tenderness, pain with stretching of the neck as well. This started a few days ago. Sometimes pain with swallowing. No fevers. No cough. Only slight nasal congestion. Denies any previous similar. doesn't feel worse necessarily. No ear pain. No specific rash.    ROS per HPI, negative if not otherwise mentioned.      History reviewed. No pertinent past medical history.  There are no problems to display for this patient.   History reviewed. No pertinent surgical history.     Home Medications    Prior to Admission medications   Medication Sig Start Date End Date Taking? Authorizing Provider  ketoconazole (NIZORAL) 2 % shampoo Apply to rash, leave on for 5 minutes, then rinse. Once a day for the next week. Then 3 times a week until it has resolved 03/17/20   Linus Mako B, NP  valACYclovir (VALTREX) 1000 MG tablet Take 1 tablet (1,000 mg total) by mouth 3 (three) times daily. 01/07/19   Eustace Moore, MD    Family History Family History  Problem Relation Age of Onset  . Healthy Mother     Social History Social History   Tobacco Use  . Smoking status: Current Every Day Smoker    Types: Cigars  Substance Use Topics  . Alcohol use: No  . Drug use: No     Allergies   Penicillins   Review of Systems Review of Systems   Physical Exam Triage Vital Signs ED Triage Vitals  Enc Vitals Group     BP 04/18/20 1221 (!) 127/95     Pulse Rate 04/18/20 1221 82     Resp 04/18/20 1221 18     Temp 04/18/20 1221 98.9 F (37.2 C)     Temp Source 04/18/20 1221 Oral     SpO2 04/18/20 1221 100 %     Weight --      Height --      Head Circumference --      Peak Flow --        Pain Score 04/18/20 1222 7     Pain Loc --      Pain Edu? --      Excl. in GC? --    No data found.  Updated Vital Signs BP (!) 127/95 (BP Location: Left Arm)   Pulse 82   Temp 98.9 F (37.2 C) (Oral)   Resp 18   SpO2 100%   Visual Acuity Right Eye Distance:   Left Eye Distance:   Bilateral Distance:    Right Eye Near:   Left Eye Near:    Bilateral Near:     Physical Exam Constitutional:      Appearance: He is well-developed.  HENT:     Right Ear: Tympanic membrane and ear canal normal.     Left Ear: Tympanic membrane and ear canal normal.     Mouth/Throat:     Tonsils: No tonsillar exudate. 1+ on the right. 1+ on the left.  Cardiovascular:     Rate and Rhythm: Normal rate.  Pulmonary:     Effort: Pulmonary effort is normal.  Lymphadenopathy:     Head:  Left side of head: Submandibular adenopathy present.  Skin:    General: Skin is warm and dry.     Comments: A few scattered hypopigmented lesions to neck noted  Neurological:     Mental Status: He is alert and oriented to person, place, and time.      UC Treatments / Results  Labs (all labs ordered are listed, but only abnormal results are displayed) Labs Reviewed - No data to display  EKG   Radiology No results found.  Procedures Procedures (including critical care time)  Medications Ordered in UC Medications - No data to display  Initial Impression / Assessment and Plan / UC Course  I have reviewed the triage vital signs and the nursing notes.  Pertinent labs & imaging results that were available during my care of the patient were reviewed by me and considered in my medical decision making (see chart for details).     Left submental lymphadenopathy without any additional symptoms. Supportive cares recommended. Return precautions provided. Patient verbalized understanding and agreeable to plan.   Final Clinical Impressions(s) / UC Diagnoses   Final diagnoses:  Submandibular  lymphadenopathy     Discharge Instructions     Exam is overall reassuring.  I would expect gradual improvement of this swelling over the next few weeks to month.  If symptoms worsen or do not improve in the next week to return to be seen or to follow up with a primary care provider.     ED Prescriptions    None     PDMP not reviewed this encounter.   Georgetta Haber, NP 04/18/20 1353

## 2020-06-12 ENCOUNTER — Ambulatory Visit (HOSPITAL_COMMUNITY)
Admission: EM | Admit: 2020-06-12 | Discharge: 2020-06-12 | Disposition: A | Discharge status: Home / Self Care | Payer: Self-pay | Attending: Emergency Medicine | Admitting: Emergency Medicine

## 2020-06-12 ENCOUNTER — Encounter (HOSPITAL_COMMUNITY): Payer: Self-pay

## 2020-06-12 ENCOUNTER — Other Ambulatory Visit: Payer: Self-pay

## 2020-06-12 DIAGNOSIS — L0231 Cutaneous abscess of buttock: Secondary | ICD-10-CM

## 2020-06-12 MED ORDER — DOXYCYCLINE HYCLATE 100 MG PO CAPS: 100.0000 mg | ORAL_CAPSULE | Freq: Two times a day (BID) | ORAL | 0 refills | Status: DC

## 2020-06-12 MED ORDER — IBUPROFEN 800 MG PO TABS
800.0000 mg | ORAL_TABLET | Freq: Once | ORAL | Status: AC
  Administered 2020-06-12: 800 mg via ORAL

## 2020-06-12 MED ORDER — IBUPROFEN 800 MG PO TABS
ORAL_TABLET | ORAL | Status: AC
  Filled 2020-06-12: qty 1

## 2020-06-12 MED ORDER — IBUPROFEN 800 MG PO TABS: 800.0000 mg | ORAL_TABLET | Freq: Three times a day (TID) | ORAL | 0 refills | Status: DC

## 2020-06-12 NOTE — ED Triage Notes (Signed)
Pt presents with abscess on middle of butt  X 3 days.

## 2020-06-12 NOTE — Discharge Instructions (Addendum)
Soak in warm water this afternoon to promote additional drainage.  Cleanse area daily with soap and water.  Keep covered to keep protected and collect drainage.   Complete course of antibiotics.  Ibuprofen as needed to help with pain.

## 2020-06-12 NOTE — ED Provider Notes (Signed)
[facility]    CSN: [id number] Arrival date & time: 06/12/20  1011      History   Chief Complaint Chief Complaint  Patient presents with  . Abscess    HPI Dennis Clayton. is a 29 y.o. male.   Dennis Clayton. presents with complaints of left buttock abscess. Noted first a few days ago and it has been worsening. No drainage. Denies any previous similar. No fevers.    ROS per HPI, negative if not otherwise mentioned.      History reviewed. No pertinent past medical history.  There are no problems to display for this patient.   History reviewed. No pertinent surgical history.     Home Medications    Prior to Admission medications   Medication Sig Start Date End Date Taking? Authorizing Provider  doxycycline (VIBRAMYCIN) 100 MG capsule Take 1 capsule (100 mg total) by mouth 2 (two) times daily. 06/12/20   Georgetta Haber, NP  ibuprofen (ADVIL) 800 MG tablet Take 1 tablet (800 mg total) by mouth 3 (three) times daily. 06/12/20   Georgetta Haber, NP  ketoconazole (NIZORAL) 2 % shampoo Apply to rash, leave on for 5 minutes, then rinse. Once a day for the next week. Then 3 times a week until it has resolved 03/17/20   Linus Mako B, NP  valACYclovir (VALTREX) 1000 MG tablet Take 1 tablet (1,000 mg total) by mouth 3 (three) times daily. 01/07/19   Eustace Moore, MD    Family History Family History  Problem Relation Age of Onset  . Healthy Mother     Social History Social History   Tobacco Use  . Smoking status: Current Every Day Smoker    Types: Cigars  Substance Use Topics  . Alcohol use: No  . Drug use: No     Allergies   Penicillins   Review of Systems Review of Systems   Physical Exam Triage Vital Signs ED Triage Vitals  Enc Vitals Group     BP 06/12/20 1110 130/85     Pulse Rate 06/12/20 1110 95     Resp 06/12/20 1110 18     Temp 06/12/20 1110 98.5 F (36.9 C)     Temp Source 06/12/20 1110 Oral     SpO2  06/12/20 1110 100 %     Weight --      Height --      Head Circumference --      Peak Flow --      Pain Score 06/12/20 1111 9     Pain Loc --      Pain Edu? --      Excl. in GC? --    No data found.  Updated Vital Signs BP 130/85 (BP Location: Right Arm)   Pulse 95   Temp 98.5 F (36.9 C) (Oral)   Resp 18   SpO2 100%   Visual Acuity Right Eye Distance:   Left Eye Distance:   Bilateral Distance:    Right Eye Near:   Left Eye Near:    Bilateral Near:     Physical Exam Constitutional:      Appearance: He is well-developed.  Cardiovascular:     Rate and Rhythm: Normal rate.  Pulmonary:     Effort: Pulmonary effort is normal.  Skin:    General: Skin is warm and dry.     Comments: Fluctuant abscess to left gluteal cleft, approximately 1.5 cm; tender; no drainage   Neurological:  Mental Status: He is alert and oriented to person, place, and time.      UC Treatments / Results  Labs (all labs ordered are listed, but only abnormal results are displayed) Labs Reviewed - No data to display  EKG   Radiology No results found.  Procedures Incision and Drainage  Date/Time: 06/12/2020 2:14 PM Performed by: Georgetta Haber, NP Authorized by: Georgetta Haber, NP   Consent:    Consent obtained:  Verbal   Consent given by:  Patient   Risks discussed:  Incomplete drainage, pain and infection   Alternatives discussed:  Alternative treatment, observation and referral Location:    Type:  Abscess   Size:  1.5   Location:  Anogenital   Anogenital location:  Gluteal cleft Pre-procedure details:    Skin preparation:  Betadine Anesthesia (see MAR for exact dosages):    Anesthesia method:  Local infiltration   Local anesthetic:  Lidocaine 2% w/o epi Procedure details:    Incision types:  Single straight   Scalpel blade:  11   Drainage:  Purulent   Drainage amount:  Moderate   Wound treatment:  Wound left open   Packing materials:  None Post-procedure  details:    Patient tolerance of procedure:  Tolerated well, no immediate complications   (including critical care time)  Medications Ordered in UC Medications  ibuprofen (ADVIL) tablet 800 mg (800 mg Oral Given 06/12/20 1143)    Initial Impression / Assessment and Plan / UC Course  I have reviewed the triage vital signs and the nursing notes.  Pertinent labs & imaging results that were available during my care of the patient were reviewed by me and considered in my medical decision making (see chart for details).     I&D of buttocks abscess. Care instructions provided. Return precautions provided. Patient verbalized understanding and agreeable to plan.   Final Clinical Impressions(s) / UC Diagnoses   Final diagnoses:  Abscess of buttock, left     Discharge Instructions     Soak in warm water this afternoon to promote additional drainage.  Cleanse area daily with soap and water.  Keep covered to keep protected and collect drainage.   Complete course of antibiotics.  Ibuprofen as needed to help with pain.    ED Prescriptions    Medication Sig Dispense Auth. Provider   doxycycline (VIBRAMYCIN) 100 MG capsule Take 1 capsule (100 mg total) by mouth 2 (two) times daily. 20 capsule Linus Mako B, NP   ibuprofen (ADVIL) 800 MG tablet Take 1 tablet (800 mg total) by mouth 3 (three) times daily. 30 tablet Georgetta Haber, NP     PDMP not reviewed this encounter.   Georgetta Haber, NP 06/12/20 1414

## 2021-06-15 ENCOUNTER — Encounter (HOSPITAL_COMMUNITY): Payer: Self-pay | Admitting: Emergency Medicine

## 2021-06-15 ENCOUNTER — Ambulatory Visit (HOSPITAL_COMMUNITY)
Admission: EM | Admit: 2021-06-15 | Discharge: 2021-06-15 | Disposition: A | Discharge status: Home / Self Care | Payer: Self-pay | Attending: Family Medicine | Admitting: Family Medicine

## 2021-06-15 DIAGNOSIS — J111 Influenza due to unidentified influenza virus with other respiratory manifestations: Secondary | ICD-10-CM

## 2021-06-15 MED ORDER — ACETAMINOPHEN 325 MG PO TABS
ORAL_TABLET | ORAL | Status: AC
  Filled 2021-06-15: qty 2

## 2021-06-15 MED ORDER — ONDANSETRON 4 MG PO TBDP: 4.0000 mg | ORAL_TABLET | Freq: Three times a day (TID) | ORAL | 0 refills | Status: AC | PRN

## 2021-06-15 MED ORDER — OSELTAMIVIR PHOSPHATE 75 MG PO CAPS: 75.0000 mg | ORAL_CAPSULE | Freq: Two times a day (BID) | ORAL | 0 refills | Status: AC

## 2021-06-15 MED ORDER — ACETAMINOPHEN 325 MG PO TABS
650.0000 mg | ORAL_TABLET | Freq: Once | ORAL | Status: AC
  Administered 2021-06-15: 650 mg via ORAL

## 2021-06-15 NOTE — ED Triage Notes (Signed)
Pt presents with cough, sore throat, and runny nose that started yesterday.

## 2021-06-15 NOTE — ED Provider Notes (Signed)
[facility]    CSN: [id number] Arrival date & time: 06/15/21  1731      History   Chief Complaint Chief Complaint  Patient presents with   Nasal Congestion   Sore Throat   Cough    HPI Dennis Clayton. is a 30 y.o. male.   Patient here for evaluation of cough, sore throat, runny nose, and congestion that has been ongoing since yesterday.  Patient has not checked temperature at home but does have a fever of 101.2 in office.  Patient reports father recently tested positive for the flu.  Has not taken any OTC medications or treatments.  Denies any trauma, injury, or other precipitating event.  Denies any specific alleviating or aggravating factors.  Denies any chest pain, shortness of breath, N/V/D, numbness, tingling, weakness, abdominal pain, or headaches.    The history is provided by the patient.  Sore Throat  Cough Associated symptoms: myalgias and sore throat    History reviewed. No pertinent past medical history.  There are no problems to display for this patient.   History reviewed. No pertinent surgical history.     Home Medications    Prior to Admission medications   Medication Sig Start Date End Date Taking? Authorizing Provider  ondansetron (ZOFRAN ODT) 4 MG disintegrating tablet Take 1 tablet (4 mg total) by mouth every 8 (eight) hours as needed for nausea or vomiting. 06/15/21  Yes Ivette Loyal, NP  oseltamivir (TAMIFLU) 75 MG capsule Take 1 capsule (75 mg total) by mouth every 12 (twelve) hours. 06/15/21  Yes Ivette Loyal, NP  doxycycline (VIBRAMYCIN) 100 MG capsule Take 1 capsule (100 mg total) by mouth 2 (two) times daily. 06/12/20   Georgetta Haber, NP  ibuprofen (ADVIL) 800 MG tablet Take 1 tablet (800 mg total) by mouth 3 (three) times daily. 06/12/20   Georgetta Haber, NP  ketoconazole (NIZORAL) 2 % shampoo Apply to rash, leave on for 5 minutes, then rinse. Once a day for the next week. Then 3 times a week until it has resolved  03/17/20   Linus Mako B, NP  valACYclovir (VALTREX) 1000 MG tablet Take 1 tablet (1,000 mg total) by mouth 3 (three) times daily. 01/07/19   Eustace Moore, MD    Family History Family History  Problem Relation Age of Onset   Healthy Mother     Social History Social History   Tobacco Use   Smoking status: Every Day    Types: Cigars   Smokeless tobacco: Never  Substance Use Topics   Alcohol use: No   Drug use: No     Allergies   Penicillins   Review of Systems Review of Systems  Constitutional:  Positive for fatigue.  HENT:  Positive for congestion and sore throat.   Respiratory:  Positive for cough.   Musculoskeletal:  Positive for myalgias.  All other systems reviewed and are negative.   Physical Exam Triage Vital Signs ED Triage Vitals  Enc Vitals Group     BP 06/15/21 1833 128/89     Pulse Rate 06/15/21 1833 (!) 110     Resp 06/15/21 1833 16     Temp 06/15/21 1833 (!) 101.2 F (38.4 C)     Temp Source 06/15/21 1833 Oral     SpO2 06/15/21 1833 98 %     Weight --      Height --      Head Circumference --      Peak Flow --  Pain Score 06/15/21 1831 0     Pain Loc --      Pain Edu? --      Excl. in GC? --    No data found.  Updated Vital Signs BP 128/89 (BP Location: Right Arm)   Pulse (!) 110   Temp (!) 101.2 F (38.4 C) (Oral)   Resp 16   SpO2 98%   Visual Acuity Right Eye Distance:   Left Eye Distance:   Bilateral Distance:    Right Eye Near:   Left Eye Near:    Bilateral Near:     Physical Exam Vitals and nursing note reviewed.  Constitutional:      General: He is not in acute distress.    Appearance: Normal appearance. He is not ill-appearing, toxic-appearing or diaphoretic.  HENT:     Head: Normocephalic and atraumatic.     Nose: Congestion present. No rhinorrhea.     Mouth/Throat:     Pharynx: Posterior oropharyngeal erythema present. No oropharyngeal exudate.  Eyes:     Conjunctiva/sclera: Conjunctivae normal.   Cardiovascular:     Rate and Rhythm: Normal rate.     Pulses: Normal pulses.     Heart sounds: Normal heart sounds.  Pulmonary:     Effort: Pulmonary effort is normal.     Breath sounds: Normal breath sounds.  Abdominal:     General: Abdomen is flat.  Musculoskeletal:        General: Normal range of motion.     Cervical back: Normal range of motion.  Skin:    General: Skin is warm and dry.  Neurological:     General: No focal deficit present.     Mental Status: He is alert and oriented to person, place, and time.  Psychiatric:        Mood and Affect: Mood normal.     UC Treatments / Results  Labs (all labs ordered are listed, but only abnormal results are displayed) Labs Reviewed - No data to display  EKG   Radiology No results found.  Procedures Procedures (including critical care time)  Medications Ordered in UC Medications  acetaminophen (TYLENOL) tablet 650 mg (650 mg Oral Given 06/15/21 1836)    Initial Impression / Assessment and Plan / UC Course  I have reviewed the triage vital signs and the nursing notes.  Pertinent labs & imaging results that were available during my care of the patient were reviewed by me and considered in my medical decision making (see chart for details).    Assessment negative for red flags or concerns.  Based on symptoms and recent exposure to the flu it is very likely that patient has influenza.  We will treat with Tamiflu 1 pill twice a day for the next 5 days.  May take Zofran as needed for nausea vomiting.  Tylenol and or ibuprofen as needed.  Discussed conservative symptom management as described in discharge instructions.  Encourage fluids and rest.  Instructed patient to stay home for quarantine until he is afebrile without the use of medications.  Work note given to patient.  Follow-up as needed Final Clinical Impressions(s) / UC Diagnoses   Final diagnoses:  Influenza     Discharge Instructions      Take the Tamiflu 1  pill twice a day for the next 5 days. You can take the Zofran as needed for nausea and vomiting.  You can take Tylenol and/or Ibuprofen as needed for fever reduction and pain relief.   For cough:  honey 1/2 to 1 teaspoon (you can dilute the honey in water or another fluid).  You can also use guaifenesin and dextromethorphan for cough. You can use a humidifier for chest congestion and cough.  If you don't have a humidifier, you can sit in the bathroom with the hot shower running.     For sore throat: try warm salt water gargles, cepacol lozenges, throat spray, warm tea or water with lemon/honey, popsicles or ice, or OTC cold relief medicine for throat discomfort.    For congestion: take a daily anti-histamine like Zyrtec, Claritin, and a oral decongestant, such as pseudoephedrine.  You can also use Flonase 1-2 sprays in each nostril daily.    It is important to stay hydrated: drink plenty of fluids (water, gatorade/powerade/pedialyte, juices, or teas) to keep your throat moisturized and help further relieve irritation/discomfort.   Return or go to the Emergency Department if symptoms worsen or do not improve in the next few days.      ED Prescriptions     Medication Sig Dispense Auth. Provider   oseltamivir (TAMIFLU) 75 MG capsule Take 1 capsule (75 mg total) by mouth every 12 (twelve) hours. 10 capsule Ivette Loyal, NP   ondansetron (ZOFRAN ODT) 4 MG disintegrating tablet Take 1 tablet (4 mg total) by mouth every 8 (eight) hours as needed for nausea or vomiting. 20 tablet Ivette Loyal, NP      PDMP not reviewed this encounter.   Ivette Loyal, NP 06/15/21 1905

## 2021-06-15 NOTE — Discharge Instructions (Signed)
Take the Tamiflu 1 pill twice a day for the next 5 days. You can take the Zofran as needed for nausea and vomiting.  You can take Tylenol and/or Ibuprofen as needed for fever reduction and pain relief.   For cough: honey 1/2 to 1 teaspoon (you can dilute the honey in water or another fluid).  You can also use guaifenesin and dextromethorphan for cough. You can use a humidifier for chest congestion and cough.  If you don't have a humidifier, you can sit in the bathroom with the hot shower running.     For sore throat: try warm salt water gargles, cepacol lozenges, throat spray, warm tea or water with lemon/honey, popsicles or ice, or OTC cold relief medicine for throat discomfort.    For congestion: take a daily anti-histamine like Zyrtec, Claritin, and a oral decongestant, such as pseudoephedrine.  You can also use Flonase 1-2 sprays in each nostril daily.    It is important to stay hydrated: drink plenty of fluids (water, gatorade/powerade/pedialyte, juices, or teas) to keep your throat moisturized and help further relieve irritation/discomfort.   Return or go to the Emergency Department if symptoms worsen or do not improve in the next few days.

## 2021-07-13 ENCOUNTER — Ambulatory Visit (HOSPITAL_COMMUNITY)
Admission: EM | Admit: 2021-07-13 | Discharge: 2021-07-13 | Disposition: A | Discharge status: Home / Self Care | Payer: Self-pay | Attending: Emergency Medicine | Admitting: Emergency Medicine

## 2021-07-13 ENCOUNTER — Other Ambulatory Visit: Payer: Self-pay

## 2021-07-13 ENCOUNTER — Encounter (HOSPITAL_COMMUNITY): Payer: Self-pay

## 2021-07-13 DIAGNOSIS — K610 Anal abscess: Secondary | ICD-10-CM

## 2021-07-13 MED ORDER — DOXYCYCLINE HYCLATE 100 MG PO CAPS: 100.0000 mg | ORAL_CAPSULE | Freq: Two times a day (BID) | ORAL | 0 refills | Status: AC

## 2021-07-13 MED ORDER — IBUPROFEN 800 MG PO TABS: 800.0000 mg | ORAL_TABLET | Freq: Three times a day (TID) | ORAL | 0 refills | Status: AC

## 2021-07-13 NOTE — Discharge Instructions (Addendum)
Use warm compresses or sit in a warm bath several times a day. Be aggressive with heat therapy for the next few days to try and help this resolve. Call [facility] today for an appointment.

## 2021-07-13 NOTE — ED Provider Notes (Signed)
[facility]    CSN: [id number] Arrival date & time: 07/13/21  0815      History   Chief Complaint Chief Complaint  Patient presents with   Abscess    HPI Dennis Brennon Clayton. is a 30 y.o. male.  Patient with right buttock abscess since 07/11/2021, progressively worsening.  Has not done anything to treat it.  History of similar abscess in the same location a year ago.    Abscess Associated symptoms: no fever    History reviewed. No pertinent past medical history.  There are no problems to display for this patient.   History reviewed. No pertinent surgical history.     Home Medications    Prior to Admission medications   Medication Sig Start Date End Date Taking? Authorizing Provider  doxycycline (VIBRAMYCIN) 100 MG capsule Take 1 capsule (100 mg total) by mouth 2 (two) times daily. 07/13/21   Cathlyn Parsons, NP  ibuprofen (ADVIL) 800 MG tablet Take 1 tablet (800 mg total) by mouth 3 (three) times daily. 07/13/21   Cathlyn Parsons, NP  ketoconazole (NIZORAL) 2 % shampoo Apply to rash, leave on for 5 minutes, then rinse. Once a day for the next week. Then 3 times a week until it has resolved 03/17/20   Linus Mako B, NP  ondansetron (ZOFRAN ODT) 4 MG disintegrating tablet Take 1 tablet (4 mg total) by mouth every 8 (eight) hours as needed for nausea or vomiting. 06/15/21   Ivette Loyal, NP  oseltamivir (TAMIFLU) 75 MG capsule Take 1 capsule (75 mg total) by mouth every 12 (twelve) hours. 06/15/21   Ivette Loyal, NP  valACYclovir (VALTREX) 1000 MG tablet Take 1 tablet (1,000 mg total) by mouth 3 (three) times daily. 01/07/19   Eustace Moore, MD    Family History Family History  Problem Relation Age of Onset   Healthy Mother     Social History Social History   Tobacco Use   Smoking status: Every Day    Types: Cigars   Smokeless tobacco: Never  Substance Use Topics   Alcohol use: No   Drug use: No     Allergies    Penicillins   Review of Systems Review of Systems  Constitutional:  Negative for chills and fever.  Skin:        abscess    Physical Exam Triage Vital Signs ED Triage Vitals  Enc Vitals Group     BP 07/13/21 0844 (!) 137/53     Pulse Rate 07/13/21 0844 63     Resp 07/13/21 0844 18     Temp 07/13/21 0844 99.4 F (37.4 C)     Temp Source 07/13/21 0844 Oral     SpO2 07/13/21 0844 93 %     Weight --      Height --      Head Circumference --      Peak Flow --      Pain Score 07/13/21 0847 9     Pain Loc --      Pain Edu? --      Excl. in GC? --    No data found.  Updated Vital Signs BP (!) 137/53 (BP Location: Left Arm)   Pulse 63   Temp 99.4 F (37.4 C) (Oral)   Resp 18   SpO2 93%   Visual Acuity Right Eye Distance:   Left Eye Distance:   Bilateral Distance:    Right Eye Near:   Left Eye Near:  Bilateral Near:     Physical Exam Constitutional:      Appearance: Normal appearance.     Comments: Appears uncomfortable  Genitourinary:   Neurological:     Mental Status: He is alert.     UC Treatments / Results  Labs (all labs ordered are listed, but only abnormal results are displayed) Labs Reviewed - No data to display  EKG   Radiology No results found.  Procedures Procedures (including critical care time)  Medications Ordered in UC Medications - No data to display  Initial Impression / Assessment and Plan / UC Course  I have reviewed the triage vital signs and the nursing notes.  Pertinent labs & imaging results that were available during my care of the patient were reviewed by me and considered in my medical decision making (see chart for details).    Discussed treatment options with pt. Likely forming abscess. Given perianal location, lack of fluctuance, depth of lesion, and hx similar abscess last year, I am hesitant to I&D it. Advised frequent heat therapy (warm compresses or baths), rx antibiotics, referred to general surgery.    Final Clinical Impressions(s) / UC Diagnoses   Final diagnoses:  Perianal abscess     Discharge Instructions      Use warm compresses or sit in a warm bath several times a day. Be aggressive with heat therapy for the next few days to try and help this resolve. Call [facility] today for an appointment.    ED Prescriptions     Medication Sig Dispense Auth. Provider   doxycycline (VIBRAMYCIN) 100 MG capsule Take 1 capsule (100 mg total) by mouth 2 (two) times daily. 20 capsule Cathlyn Parsons, NP   ibuprofen (ADVIL) 800 MG tablet Take 1 tablet (800 mg total) by mouth 3 (three) times daily. 30 tablet Cathlyn Parsons, NP      PDMP not reviewed this encounter.   Cathlyn Parsons, NP 07/13/21 [phone]

## 2021-07-13 NOTE — ED Triage Notes (Signed)
Pt presents with abscess on right buttocks X 2 days. 

## 2021-07-14 ENCOUNTER — Other Ambulatory Visit: Payer: Self-pay

## 2021-07-14 ENCOUNTER — Emergency Department (HOSPITAL_COMMUNITY)
Admission: EM | Admit: 2021-07-14 | Discharge: 2021-07-14 | Disposition: A | Discharge status: Home / Self Care | Payer: Self-pay | Attending: Emergency Medicine | Admitting: Emergency Medicine

## 2021-07-14 ENCOUNTER — Encounter (HOSPITAL_COMMUNITY): Payer: Self-pay | Admitting: Emergency Medicine

## 2021-07-14 ENCOUNTER — Emergency Department (HOSPITAL_COMMUNITY): Payer: Self-pay

## 2021-07-14 DIAGNOSIS — K611 Rectal abscess: Secondary | ICD-10-CM | POA: Insufficient documentation

## 2021-07-14 DIAGNOSIS — Z5321 Procedure and treatment not carried out due to patient leaving prior to being seen by health care provider: Secondary | ICD-10-CM | POA: Insufficient documentation

## 2021-07-14 LAB — BASIC METABOLIC PANEL
Anion gap: 8 (ref 5–15)
BUN: 6 mg/dL (ref 6–20)
CO2: 24 mmol/L (ref 22–32)
Calcium: 8.8 mg/dL — ABNORMAL LOW (ref 8.9–10.3)
Chloride: 103 mmol/L (ref 98–111)
Creatinine, Ser: 0.81 mg/dL (ref 0.61–1.24)
GFR, Estimated: >60 mL/min (ref >60)
Glucose, Bld: 104 mg/dL — ABNORMAL HIGH (ref 70–99)
Potassium: 4.3 mmol/L (ref 3.5–5.1)
Sodium: 135 mmol/L (ref 135–145)

## 2021-07-14 LAB — CBC WITH DIFFERENTIAL/PLATELET
Abs Immature Granulocytes: 0.04 10*3/uL (ref 0.00–0.07)
Basophils Absolute: 0.1 10*3/uL (ref 0.0–0.1)
Basophils Relative: 1 %
Eosinophils Absolute: 0.1 10*3/uL (ref 0.0–0.5)
Eosinophils Relative: 1 %
HCT: 48.5 % (ref 39.0–52.0)
Hemoglobin: 16.3 g/dL (ref 13.0–17.0)
Immature Granulocytes: 1 %
Lymphocytes Relative: 29 %
Lymphs Abs: 2.4 10*3/uL (ref 0.7–4.0)
MCH: 31.7 pg (ref 26.0–34.0)
MCHC: 33.6 g/dL (ref 30.0–36.0)
MCV: 94.4 fL (ref 80.0–100.0)
Monocytes Absolute: 1 10*3/uL (ref 0.1–1.0)
Monocytes Relative: 12 %
Neutro Abs: 4.7 10*3/uL (ref 1.7–7.7)
Neutrophils Relative %: 56 %
Platelets: 188 10*3/uL (ref 150–400)
RBC: 5.14 MIL/uL (ref 4.22–5.81)
RDW: 13.7 % (ref 11.5–15.5)
WBC: 8.2 10*3/uL (ref 4.0–10.5)
nRBC: 0 % (ref 0.0–0.2)

## 2021-07-14 MED ORDER — IOHEXOL 300 MG/ML  SOLN
80.0000 mL | Freq: Once | INTRAMUSCULAR | Status: AC | PRN
  Administered 2021-07-14: 80 mL via INTRAVENOUS

## 2021-07-14 NOTE — ED Notes (Signed)
Pt not responding for vitals  

## 2021-07-14 NOTE — ED Triage Notes (Signed)
Patient here with abscess on right buttocks that he first noticed two days ago. Patient states approximately one year ago he had an abscess in the same location, had it drained and was prescribed antibiotics. Patient alert, oriented, and in no apparent distress at this time.

## 2021-07-14 NOTE — ED Provider Notes (Signed)
Emergency Medicine Provider Triage Evaluation Note  Dennis Clayton. , a 30 y.o. male  was evaluated in triage.  Pt complains of abscess.  Has been present the last few days.  Located to right perirectal, rectal region.  No fevers, emesis.  Did start draining while he was in the waiting room, purulent  Review of Systems  Positive: abscess Negative: Fever, emesis  Physical Exam  There were no vitals taken for this visit. Gen:   Awake, no distress   Resp:  Normal effort  GU:  Right sided fluctuance, induration, overall difficult exam however possibly involves rectum, pin point area of drainage MSK:   Moves extremities without difficulty  Other:    Medical Decision Making  Medically screening exam initiated at 7:01 AM.  Appropriate orders placed.  Dennis Clayton. was informed that the remainder of the evaluation will be completed by another provider, this initial triage assessment does not replace that evaluation, and the importance of remaining in the ED until their evaluation is complete.  Abscess  Will get CT giving possible extension into anus   Declan Adamson A, PA-C 07/14/21 6789    Tilden Fossa, MD 07/14/21 [phone]

## 2023-04-27 IMAGING — CT CT PELVIS W/ CM
2 of 3 series · 17 of 46 positions shown, 19 images · IV contrast (APPLIED)
Comparison: None.

CLINICAL DATA: Abdominal abscess, suspected right rectal abscess

EXAM:
CT PELVIS WITH CONTRAST
TECHNIQUE: Multidetector CT imaging of the pelvis was performed using the
standard protocol following the bolus administration of intravenous
contrast.
CONTRAST:  80mL OMNIPAQUE IOHEXOL 300 MG/ML  SOLN

[Series 5: soft tissue · axial · 0.77mm/px · z∈[+766,+1032]mm · 14 of 153 slices shown, 16 images]
[im 10/153  soft-tissue]
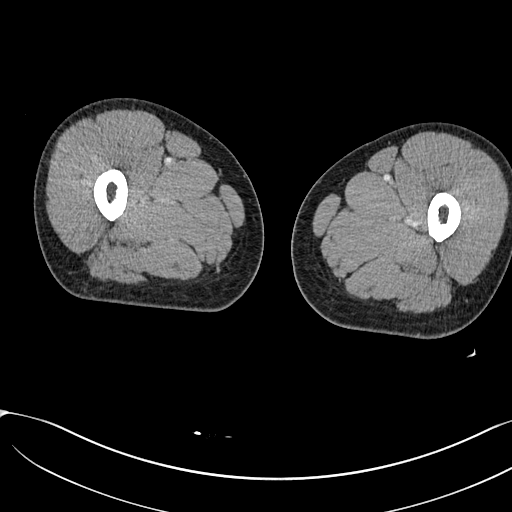
[im 10/153  bone]
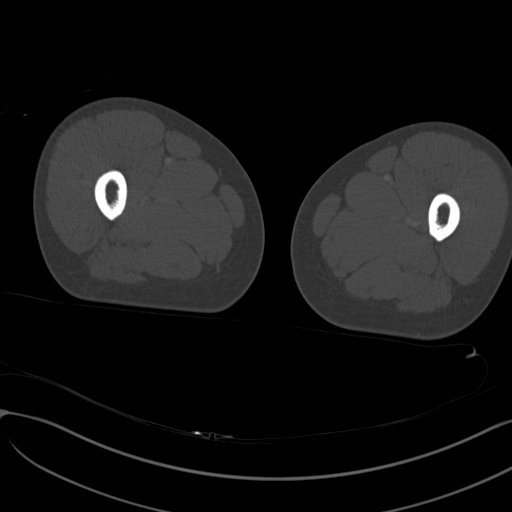
[im 20/153  soft-tissue]
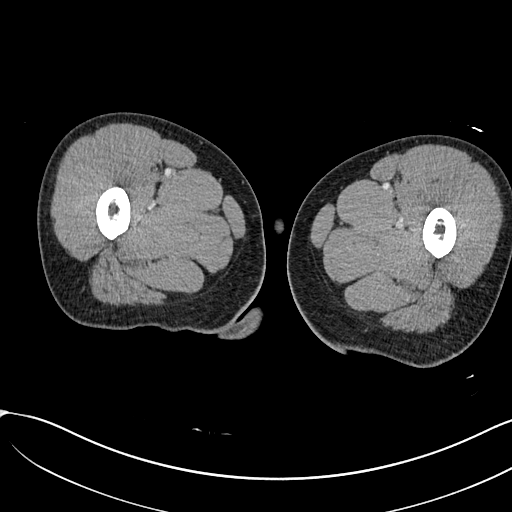
[im 30/153  soft-tissue]
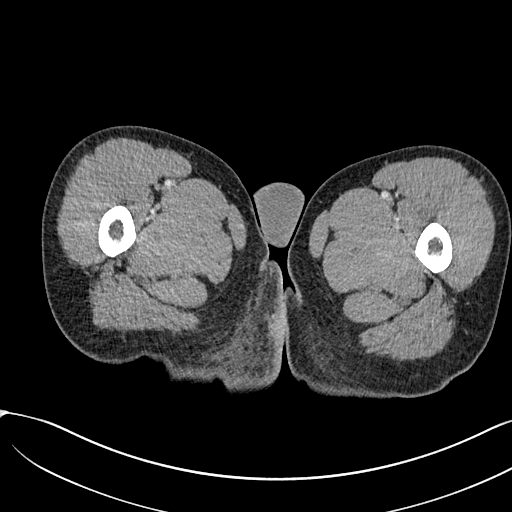
[im 40/153  soft-tissue]
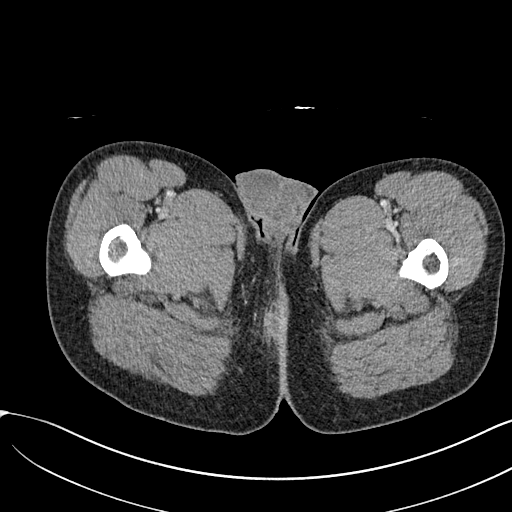
[im 50/153  soft-tissue]
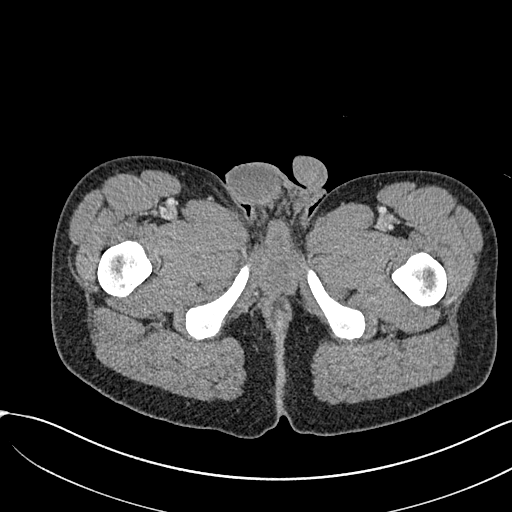
[im 59/153  soft-tissue]
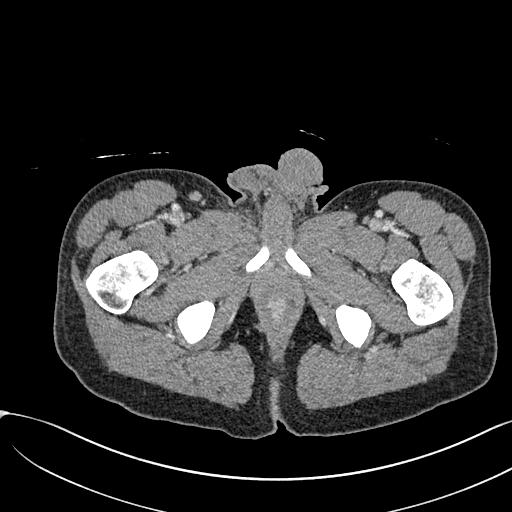
[im 69/153  soft-tissue]
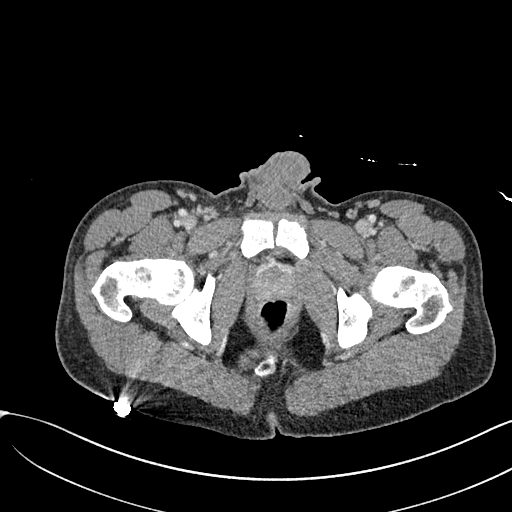
[im 84/153  soft-tissue]
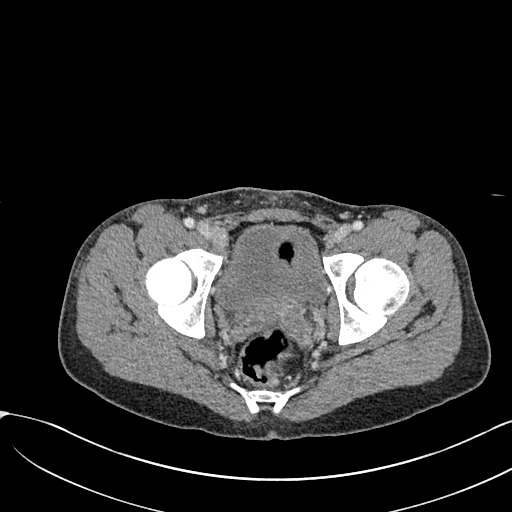
[im 94/153  soft-tissue]
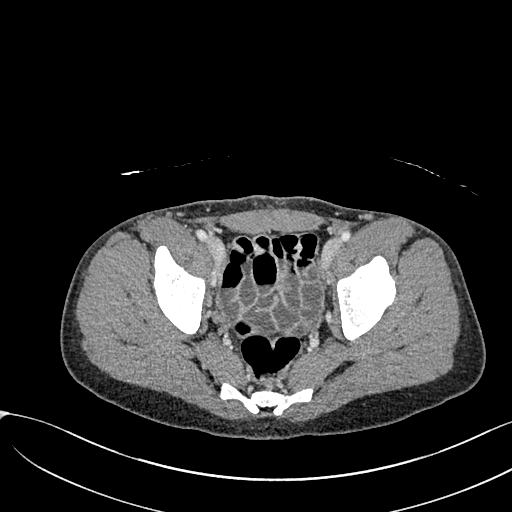
[im 94/153  bone]
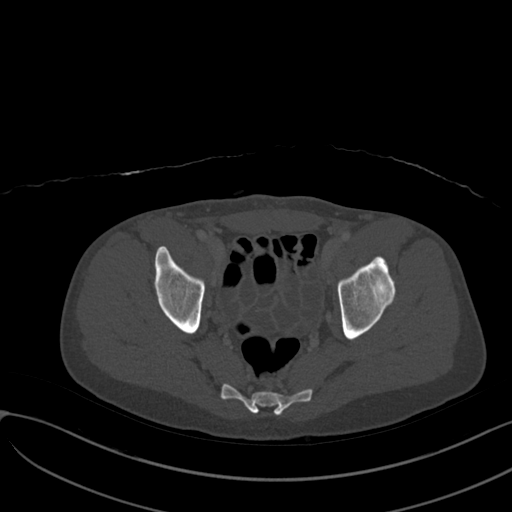
[im 103/153  soft-tissue]
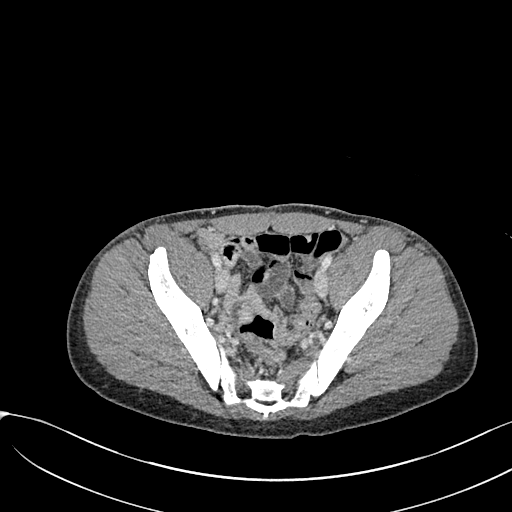
[im 113/153  soft-tissue]
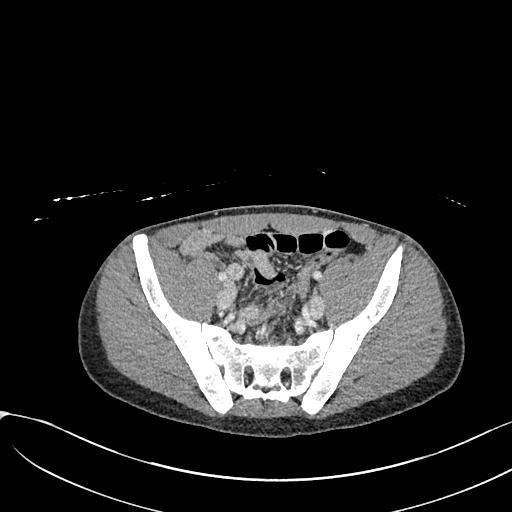
[im 123/153  soft-tissue]
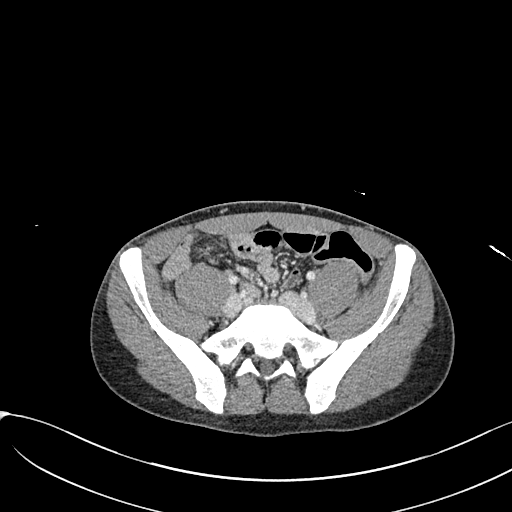
[im 133/153  soft-tissue]
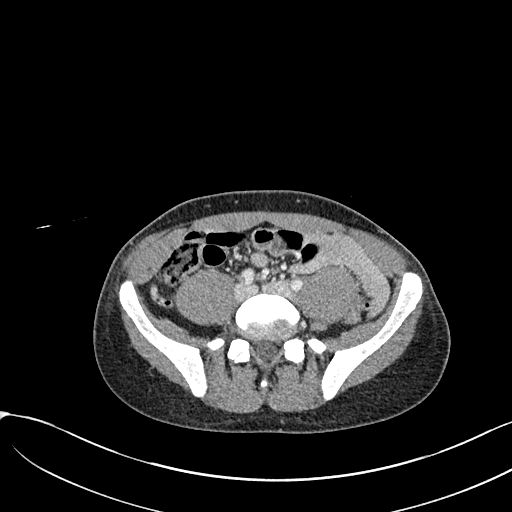
[im 143/153  soft-tissue]
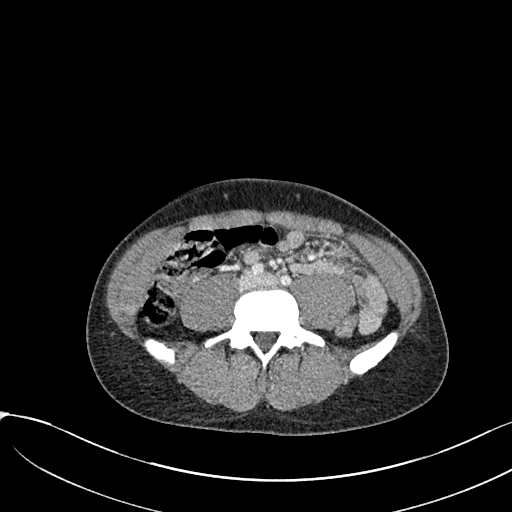

[Series 6: cor soft · coronal · 0.60mm/px · 3 of 137 slices shown]
[im 46/137  soft-tissue]
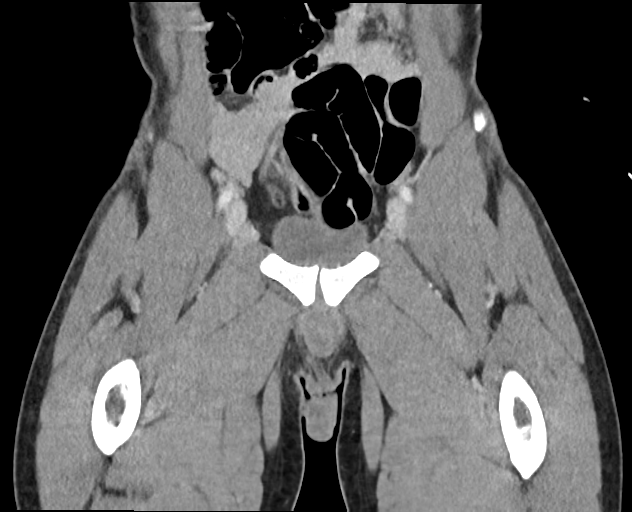
[im 61/137  soft-tissue]
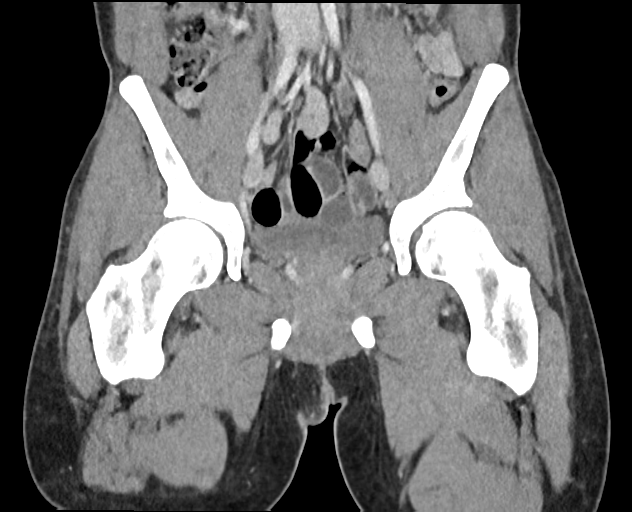
[im 76/137  soft-tissue]
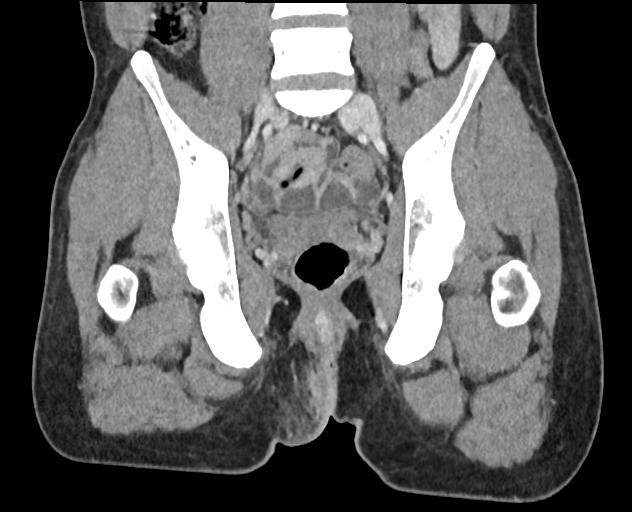

[17 of 46 positions shown; findings below may reference images not displayed]

FINDINGS: Urinary Tract:  No abnormality visualized.

Bowel: Unremarkable visualized pelvic bowel loops. No rectal wall
thickening or perirectal abscess identified. Appendix is normal.

Vascular/Lymphatic: Mildly enlarged right external iliac and
inguinal lymph nodes measuring up to 1.4 cm in short axis. No
significant vascular abnormality visualized.

Reproductive:  Prostate gland appears within normal limits.

Other: Peripherally enhancing subcutaneous collection identified in
the right medial gluteal fold perianal region measuring 1.7 x 1 x
2.9 cm in AP, transverse and craniocaudal dimensions, consistent
with abscess. Associated surrounding fat stranding.

Musculoskeletal: No suspicious bone lesions identified.
IMPRESSION: 1. Subcutaneous abscess collection in the right medial gluteal fold
perianal region. Surrounding fat stranding.
2. Likely reactive lymphadenopathy in the right pelvis.
